# Patient Record
Sex: Male | Born: 1970 | Race: Black or African American | Hispanic: No | Marital: Married | Smoking: Never smoker
Health system: Southern US, Community
[De-identification: ages and names within clinical notes are randomized; demographics above are authoritative.]

## PROBLEM LIST (undated history)

## (undated) DIAGNOSIS — Q250 Patent ductus arteriosus: Principal | ICD-10-CM

## (undated) DIAGNOSIS — R0683 Snoring: Secondary | ICD-10-CM

## (undated) HISTORY — DX: Snoring: R06.83

## (undated) HISTORY — DX: Patent ductus arteriosus: Q25.0

## (undated) HISTORY — DX: Morbid (severe) obesity due to excess calories: E66.01

---

## 2015-07-09 ENCOUNTER — Telehealth: Payer: Self-pay

## 2015-07-09 NOTE — Telephone Encounter (Signed)
NO NOTES, CALLED PATIENT NO ANSWER,PHONE NUMBER IS D/ AND NO PCP

## 2015-07-12 ENCOUNTER — Ambulatory Visit (INDEPENDENT_AMBULATORY_CARE_PROVIDER_SITE_OTHER): Payer: BC Managed Care – PPO | Admitting: Interventional Cardiology

## 2015-07-12 ENCOUNTER — Encounter: Payer: Self-pay | Admitting: Interventional Cardiology

## 2015-07-12 VITALS — BP 118/74 | HR 74 | Ht 63.0 in | Wt 271.8 lb

## 2015-07-12 DIAGNOSIS — Q250 Patent ductus arteriosus: Secondary | ICD-10-CM | POA: Insufficient documentation

## 2015-07-12 DIAGNOSIS — R0683 Snoring: Secondary | ICD-10-CM

## 2015-07-12 HISTORY — DX: Patent ductus arteriosus: Q25.0

## 2015-07-12 HISTORY — DX: Snoring: R06.83

## 2015-07-12 HISTORY — DX: Morbid (severe) obesity due to excess calories: E66.01

## 2015-07-12 NOTE — Patient Instructions (Addendum)
Medication Instructions:  Your physician recommends that you continue on your current medications as directed. Please refer to the Current Medication list given to you today.   Labwork:    Testing/Procedures: Your physician has requested that you have an echocardiogram. Echocardiography is a painless test that uses sound waves to create images of your heart. It provides your doctor with information about the size and shape of your heart and how well your heart's chambers and valves are working. This procedure takes approximately one hour. There are no restrictions for this procedure.   Follow-Up:  Your physician wants you to follow-up in: 18-24 months with Dr.Smith You will receive a reminder letter in the mail two months in advance. If you don't receive a letter, please call our office to schedule the follow-up appointment.   Any Other Special Instructions Will Be Listed Below (If Applicable).     If you need a refill on your cardiac medications before your next appointment, please call your pharmacy.

## 2015-07-12 NOTE — Progress Notes (Signed)
Cardiology Office Note   Date:  07/12/2015   ID:  Brent Hernandez, DOB Jun 03, 1970, MRN [id number]  PCP:  No PCP Per Patient  Cardiologist:  Lesleigh Noe, MD   Chief Complaint  Patient presents with  . Heart Murmur    PDA      History of Present Illness: Brent Hernandez is a 45 y.o. male who presents for Establishment and follow-up of patent ductus arteriosus  Asymptomatic but the diagnosis was made at [facility] by heart catheterization in 2000. This was in the setting of chest discomfort that led to coronary angiography as well as left and right heart cath. Shot was documented to be 1.2-1. Dr. Nolon Rod was his attending. He is asymptomatic. He denies dyspnea. No lower extremity swelling, palpitations, syncope, or other complaints.  Past Medical History  Diagnosis Date  . PDA (patent ductus arteriosus) 07/12/2015  . Morbid obesity (HCC) 07/12/2015  . Snoring 07/12/2015    No past surgical history on file.   No current outpatient prescriptions on file.   No current facility-administered medications for this visit.    Allergies:   Review of patient's allergies indicates no known allergies.    Social History:  The patient  reports that he has never smoked. He has never used smokeless tobacco.   Family History:  The patient's family history includes Diabetes Mellitus II in his brother and father; Healthy in his maternal grandfather, mother, and sister; Prostate cancer in his father and paternal grandfather.    ROS:  Please see the history of present illness.   Otherwise, review of systems are positive for Obesity and needs to exercise.   All other systems are reviewed and negative.    PHYSICAL EXAM: VS:  BP 118/74 mmHg  Pulse 74  Ht  (1.6 m)  Wt 271 lb 12.8 oz (123.288 kg)  BMI 48.16 kg/m2 , BMI Body mass index is 48.16 kg/(m^2). GEN: Well nourished, well developed, in no acute distress HEENT: normal Neck: no JVD, carotid bruits, or  masses Cardiac: RRR.  There is no murmur, rub, or gallop. There is no edema. Respiratory:  clear to auscultation bilaterally, normal work of breathing. GI: soft, nontender, nondistended, + BS MS: no deformity or atrophy Skin: warm and dry, no rash Neuro:  Strength and sensation are intact Psych: euthymic mood, full affect   EKG:  EKG is ordered today. The ekg reveals normal sinus rhythm with prominent voltage and nonspecific ST-T wave abnormality.   Recent Labs: No results found for requested labs within last 365 days.    Lipid Panel No results found for: CHOL, TRIG, HDL, CHOLHDL, VLDL, LDLCALC, LDLDIRECT    Wt Readings from Last 3 Encounters:  07/12/15 271 lb 12.8 oz (123.288 kg)      Other studies Reviewed: Additional studies/ records that were reviewed today include: Review of Epic medical records from [place]. The findings include documentation of PDA by left and right heart cath with a left to right shunt of 1.2.    ASSESSMENT AND PLAN:  1. Patent ductus arteriosis which is asymptomatic. Documented showed 1.2-1 2. Obesity, morbid 3. Snoring, but no symptoms to suggest sleep apnea   Current medicines are reviewed at length with the patient today.  The patient has the following concerns regarding medicines: .  The following changes/actions have been instituted:    2D doppler echocardiogram  Labs/ tests ordered today include:   Orders Placed This Encounter  Procedures  . EKG 12-Lead  .  Echocardiogram     Disposition:   FU with HS in 2 years  Signed, Lesleigh NoeSMITH III,Miya Luviano W, MD  07/12/2015 1:31 PM    [facility] [address], [place], [place]  [address] Phone: [phone]; Fax: [phone]

## 2015-07-28 ENCOUNTER — Ambulatory Visit (HOSPITAL_COMMUNITY): Payer: BC Managed Care – PPO | Attending: Cardiovascular Disease

## 2015-07-28 ENCOUNTER — Other Ambulatory Visit: Payer: Self-pay

## 2015-07-28 DIAGNOSIS — I517 Cardiomegaly: Secondary | ICD-10-CM | POA: Diagnosis not present

## 2015-07-28 DIAGNOSIS — Q250 Patent ductus arteriosus: Secondary | ICD-10-CM

## 2015-07-28 DIAGNOSIS — Z6841 Body Mass Index (BMI) 40.0 and over, adult: Secondary | ICD-10-CM | POA: Insufficient documentation

## 2019-11-16 NOTE — Progress Notes (Signed)
Cardiology Office Note:    Date:  11/17/2019   ID:  [name], DOB 1970/11/08, MRN [id number]  PCP:  Lavone Orn, MD  Cardiologist:  No primary care provider on file.   Referring MD: Lavone Orn, MD   Chief Complaint  Patient presents with  . Advice Only    Known patent ductus arteriosus.    History of Present Illness:    Brent Hernandez is a 49 y.o. male with a hx of PDA with 1.2 to 1 left to right shunt [facility] by heart catheterization in 2000) in setting of chest pain evaluation.  The patient was last seen in 2017.  At that time he was doing well.  An echocardiogram did not demonstrate any evidence of right heart volume or pressure overload.  A patent ductus arteriosus could not be identified with the echo study that was performed.  Past Medical History:  Diagnosis Date  . Morbid obesity ([place]) 07/12/2015  . PDA (patent ductus arteriosus) 07/12/2015  . Snoring 07/12/2015    History reviewed. No pertinent surgical history.  Current Medications: No outpatient medications have been marked as taking for the 11/17/19 encounter (Office Visit) with Belva Crome, MD.     Allergies:   Patient has no known allergies.   Social History   Socioeconomic History  . Marital status: Single    Spouse name: Not on file  . Number of children: Not on file  . Years of education: Not on file  . Highest education level: Not on file  Occupational History  . Not on file  Tobacco Use  . Smoking status: Never Smoker  . Smokeless tobacco: Never Used  Substance and Sexual Activity  . Alcohol use: Yes    Alcohol/week: 0.0 standard drinks    Comment: Ocassionally  . Drug use: Not on file  . Sexual activity: Not on file  Other Topics Concern  . Not on file  Social History Narrative  . Not on file   Social Determinants of Health   Financial Resource Strain:   . Difficulty of Paying Living Expenses:   Food Insecurity:   . Worried About Charity fundraiser in the Last Year:   . Youth worker in the Last Year:   Transportation Needs:   . Film/video editor (Medical):   Marland Kitchen Lack of Transportation (Non-Medical):   Physical Activity:   . Days of Exercise per Week:   . Minutes of Exercise per Session:   Stress:   . Feeling of Stress :   Social Connections:   . Frequency of Communication with Friends and Family:   . Frequency of Social Gatherings with Friends and Family:   . Attends Religious Services:   . Active Member of Clubs or Organizations:   . Attends Archivist Meetings:   Marland Kitchen Marital Status:      Family History: The patient's family history includes Diabetes Mellitus II in his brother and father; Healthy in his maternal grandfather, mother, and sister; Prostate cancer in his father and paternal grandfather.  ROS:   Please see the history of present illness.    No complaints.  Came back to check-in since last contact with cardiology was 4 years ago.  All other systems reviewed and are negative.  EKGs/Labs/Other Studies Reviewed:    The following studies were reviewed today:  2D Doppler echocardiogram 2017: Study Conclusions   - Left ventricle: The cavity size was normal. Systolic function was  normal. The estimated ejection fraction  was in the range of 55%  to 60%. Wall motion was normal; there were no regional wall  motion abnormalities.  - Left atrium: The atrium was mildly dilated.  - Atrial septum: No defect or patent foramen ovale was identified.  - Impressions: Ductas flow not identified on basal short axis  images of MPA or suprasternal notch images of the aorta  Consider f/u cardiac MRI or CTA.   Impressions:   - Ductas flow not identified on basal short axis images of MPA or  suprasternal notch images of the aorta  Consider f/u cardiac MRI or CTA.   EKG:  EKG normal sinus rhythm with nonspecific ST abnormality.  Recent Labs: No results found for requested labs within last 8760 hours.  Recent Lipid Panel No  results found for: CHOL, TRIG, HDL, CHOLHDL, VLDL, LDLCALC, LDLDIRECT  Physical Exam:    VS:  BP 128/62   Pulse 63   Ht 5' 3"  (1.6 m)   Wt 260 lb 6.4 oz (118.1 kg)   SpO2 98%   BMI 46.13 kg/m     Wt Readings from Last 3 Encounters:  11/17/19 260 lb 6.4 oz (118.1 kg)  07/12/15 271 lb 12.8 oz (123.3 kg)     GEN: Obese, morbidly.. No acute distress HEENT: Normal NECK: No JVD. LYMPHATICS: No lymphadenopathy CARDIAC:  RRR without murmur, gallop, or edema. VASCULAR:  Normal Pulses. No bruits. RESPIRATORY:  Clear to auscultation without rales, wheezing or rhonchi  ABDOMEN: Soft, non-tender, non-distended, No pulsatile mass, MUSCULOSKELETAL: No deformity  SKIN: Warm and dry NEUROLOGIC:  Alert and oriented x 3 PSYCHIATRIC:  Normal affect   ASSESSMENT:    1. PDA (patent ductus arteriosus)   2. Morbid obesity ([place])   3. Snoring   4. Educated about COVID-19 virus infection    PLAN:    In order of problems listed above:  1. PDA could be a source/nidus for endocarditis. Rarely can be associated with aneurysm formation. Needs cardiac CT to evaluate for structural abnormality and presence or absence of PDA.Marland Kitchen  Clinical exam does not suggest volume overload or other issues.  If RV enlargement is noted, we may consider doing a sleep study given his morbid obesity. 2. Morbid obesity 3. Snoring in the setting of morbid obesity could represent undiagnosed sleep apnea. 4. Vaccine is been received, mRNA.  Likely follow-up in 2 to 4 years.  CT scan will be evaluated and conversation with the patient after the study is available.  Further work-up could be necessary if there is any signal that there is RV enlargement.   Medication Adjustments/Labs and Tests Ordered: Current medicines are reviewed at length with the patient today.  Concerns regarding medicines are outlined above.  Orders Placed This Encounter  Procedures  . CT CORONARY MORPH W/CTA COR W/SCORE W/CA W/CM &/OR WO/CM  . Basic  metabolic panel  . EKG 12-Lead   No orders of the defined types were placed in this encounter.   Patient Instructions  Medication Instructions:  Your physician recommends that you continue on your current medications as directed. Please refer to the Current Medication list given to you today.  *If you need a refill on your cardiac medications before your next appointment, please call your pharmacy*   Lab Work: BMET today  If you have labs (blood work) drawn today and your tests are completely normal, you will receive your results only by: Marland Kitchen MyChart Message (if you have MyChart) OR . A paper copy in the mail If you  have any lab test that is abnormal or we need to change your treatment, we will call you to review the results.   Testing/Procedures: Your physician recommends that you have a Coronary CT performed.   Follow-Up: At [facility], you and your health needs are our priority.  As part of our continuing mission to provide you with exceptional heart care, we have created designated Provider Care Teams.  These Care Teams include your primary Cardiologist (physician) and Advanced Practice Providers (APPs -  Physician Assistants and Nurse Practitioners) who all work together to provide you with the care you need, when you need it.  We recommend signing up for the patient portal called "MyChart".  Sign up information is provided on this After Visit Summary.  MyChart is used to connect with patients for Virtual Visits (Telemedicine).  Patients are able to view lab/test results, encounter notes, upcoming appointments, etc.  Non-urgent messages can be sent to your provider as well.   To learn more about what you can do with MyChart, go to [email or web address].    Your next appointment:   3 year(s)  The format for your next appointment:   In Person  Provider:   You may see Dr. Daneen Schick or one of the following Advanced Practice Providers on your designated Care Team:     Truitt Merle, NP  Cecilie Kicks, NP  Kathyrn Drown, NP    Other Instructions  Your cardiac CT will be scheduled at one of the below locations:   [facility] [address] [place], [place] [address] [phone]  [place] [address] [place], [place] [address] [phone]  If scheduled at [facility], please arrive at the [facility] main entrance of [facility] 30 minutes prior to test start time. Proceed to the [facility] Radiology Department (first floor) to check-in and test prep.  If scheduled at [facility], please arrive 15 mins early for check-in and test prep.  Please follow these instructions carefully (unless otherwise directed):  Hold all erectile dysfunction medications at least 3 days (72 hrs) prior to test.  On the Night Before the Test: . Be sure to Drink plenty of water. . Do not consume any caffeinated/decaffeinated beverages or chocolate 12 hours prior to your test. . Do not take any antihistamines 12 hours prior to your test.  On the Day of the Test: . Drink plenty of water. Do not drink any water within one hour of the test. . Do not eat any food 4 hours prior to the test. . You may take your regular medications prior to the test.  . Take metoprolol (Lopressor) two hours prior to test.       After the Test: . Drink plenty of water. . After receiving IV contrast, you may experience a mild flushed feeling. This is normal. . On occasion, you may experience a mild rash up to 24 hours after the test. This is not dangerous. If this occurs, you can take Benadryl 25 mg and increase your fluid intake. . If you experience trouble breathing, this can be serious. If it is severe call 911 IMMEDIATELY. If it is mild, please call our office. . If you take any of these medications: Glipizide/Metformin, Avandament, Glucavance, please do not take 48  hours after completing test unless otherwise instructed.   Once we have confirmed authorization from your insurance company, we will call you to set up a  date and time for your test. Based on how quickly your insurance processes prior authorizations requests, please allow up to 4 weeks to be contacted for scheduling your Cardiac CT appointment. Be advised that routine Cardiac CT appointments could be scheduled as many as 8 weeks after your provider has ordered it.  For non-scheduling related questions, please contact the cardiac imaging nurse navigator should you have any questions/concerns: Marchia Bond, Cardiac Imaging Nurse Navigator Burley Saver, Interim Cardiac Imaging Nurse [place] [facility] Direct Office Dial: [phone]   For scheduling needs, including cancellations and rescheduling, please call Vivien Rota at [phone], option 3.       Signed, Sinclair Grooms, MD  11/17/2019 6:53 PM    [place]

## 2019-11-17 ENCOUNTER — Encounter: Payer: Self-pay | Admitting: Interventional Cardiology

## 2019-11-17 ENCOUNTER — Other Ambulatory Visit: Payer: Self-pay

## 2019-11-17 ENCOUNTER — Ambulatory Visit: Payer: BC Managed Care – PPO | Admitting: Interventional Cardiology

## 2019-11-17 VITALS — BP 128/62 | HR 63 | Ht 63.0 in | Wt 260.4 lb

## 2019-11-17 DIAGNOSIS — Q250 Patent ductus arteriosus: Secondary | ICD-10-CM

## 2019-11-17 DIAGNOSIS — R0683 Snoring: Secondary | ICD-10-CM

## 2019-11-17 DIAGNOSIS — Z7189 Other specified counseling: Secondary | ICD-10-CM | POA: Diagnosis not present

## 2019-11-17 NOTE — Patient Instructions (Addendum)
Medication Instructions:  Your physician recommends that you continue on your current medications as directed. Please refer to the Current Medication list given to you today.  *If you need a refill on your cardiac medications before your next appointment, please call your pharmacy*   Lab Work: BMET today  If you have labs (blood work) drawn today and your tests are completely normal, you will receive your results only by: Marland Kitchen MyChart Message (if you have MyChart) OR . A paper copy in the mail If you have any lab test that is abnormal or we need to change your treatment, we will call you to review the results.   Testing/Procedures: Your physician recommends that you have a Coronary CT performed.   Follow-Up: At [facility], you and your health needs are our priority.  As part of our continuing mission to provide you with exceptional heart care, we have created designated Provider Care Teams.  These Care Teams include your primary Cardiologist (physician) and Advanced Practice Providers (APPs -  Physician Assistants and Nurse Practitioners) who all work together to provide you with the care you need, when you need it.  We recommend signing up for the patient portal called "MyChart".  Sign up information is provided on this After Visit Summary.  MyChart is used to connect with patients for Virtual Visits (Telemedicine).  Patients are able to view lab/test results, encounter notes, upcoming appointments, etc.  Non-urgent messages can be sent to your provider as well.   To learn more about what you can do with MyChart, go to [email or web address].    Your next appointment:   3 year(s)  The format for your next appointment:   In Person  Provider:   You may see Dr. Daneen Schick or one of the following Advanced Practice Providers on your designated Care Team:    Truitt Merle, NP  Cecilie Kicks, NP  Kathyrn Drown, NP    Other Instructions  Your cardiac CT will be scheduled at one  of the below locations:   [facility] [address] [place], [place] [address] [phone]  [place] [address] [place], [place] [address] [phone]  If scheduled at [facility], please arrive at the [facility] main entrance of [facility] 30 minutes prior to test start time. Proceed to the [facility] Radiology Department (first floor) to check-in and test prep.  If scheduled at [facility], please arrive 15 mins early for check-in and test prep.  Please follow these instructions carefully (unless otherwise directed):  Hold all erectile dysfunction medications at least 3 days (72 hrs) prior to test.  On the Night Before the Test: . Be sure to Drink plenty of water. . Do not consume any caffeinated/decaffeinated beverages or chocolate 12 hours prior to your test. . Do not take any antihistamines 12 hours prior to your test.  On the Day of the Test: . Drink plenty of water. Do not drink any water within one hour of the test. . Do not eat any food 4 hours prior to the test. . You may take your regular medications prior to the test.        After the Test: . Drink plenty of water. . After receiving IV contrast, you may experience a mild flushed feeling. This is normal. . On occasion, you may experience a mild rash up to 24 hours after the test. This is not dangerous. If this  occurs, you can take Benadryl 25 mg and increase your fluid intake. . If you experience trouble breathing, this can be serious. If it is severe call 911 IMMEDIATELY. If it is mild, please call our office. . If you take any of these medications: Glipizide/Metformin, Avandament, Glucavance, please do not take 48 hours after completing test unless otherwise instructed.   Once we have confirmed authorization from your insurance company, we will call you to set up a date and time for your test.  Based on how quickly your insurance processes prior authorizations requests, please allow up to 4 weeks to be contacted for scheduling your Cardiac CT appointment. Be advised that routine Cardiac CT appointments could be scheduled as many as 8 weeks after your provider has ordered it.  For non-scheduling related questions, please contact the cardiac imaging nurse navigator should you have any questions/concerns: Marchia Bond, Cardiac Imaging Nurse Navigator Burley Saver, Interim Cardiac Imaging Nurse [place] [facility] Services Direct Office Dial: [phone]   For scheduling needs, including cancellations and rescheduling, please call Vivien Rota at [phone], option 3.

## 2019-11-18 ENCOUNTER — Telehealth: Payer: Self-pay | Admitting: *Deleted

## 2019-11-18 LAB — BASIC METABOLIC PANEL
BUN/Creatinine Ratio: 16 (ref 9–20)
BUN: 15 mg/dL (ref 6–24)
CO2: 24 mmol/L (ref 20–29)
Calcium: 9.4 mg/dL (ref 8.7–10.2)
Chloride: 102 mmol/L (ref 96–106)
Creatinine, Ser: 0.93 mg/dL (ref 0.76–1.27)
GFR calc Af Amer: 111 mL/min/{1.73_m2} (ref >59)
GFR calc non Af Amer: 96 mL/min/{1.73_m2} (ref >59)
Glucose: 111 mg/dL — ABNORMAL HIGH (ref 65–99)
Potassium: 4.3 mmol/L (ref 3.5–5.2)
Sodium: 139 mmol/L (ref 134–144)

## 2019-11-18 NOTE — Telephone Encounter (Signed)
Left message to call back.   Need to go over Coronary CT instructions.

## 2019-11-18 NOTE — Telephone Encounter (Signed)
-----   Message from Julio Sicks, RN sent at 11/17/2019  4:17 PM EDT ----- Contact pt with new information on Coronary CT

## 2019-11-19 NOTE — Addendum Note (Signed)
Addended by: Julio Sicks on: 11/19/2019 08:26 AM   Modules accepted: Orders

## 2019-11-25 NOTE — Telephone Encounter (Signed)
Left message to call back  

## 2019-12-01 NOTE — Telephone Encounter (Signed)
Spoke with pt and reviewed CT instructions.  Pt verified that he did receive his physical copy.  Advised to call back if he has any questions. Pt appreciative for call.

## 2019-12-19 ENCOUNTER — Telehealth (HOSPITAL_COMMUNITY): Payer: Self-pay | Admitting: Emergency Medicine

## 2019-12-19 NOTE — Telephone Encounter (Signed)
Reaching out to patient to offer assistance regarding upcoming cardiac imaging study; pt verbalizes understanding of appt date/time, parking situation and where to check in, pre-test NPO status and medications ordered, and verified current allergies; name and call back number provided for further questions should they arise Endre Coutts RN Navigator Cardiac Imaging [facility] [phone] office [phone] cell 

## 2019-12-22 ENCOUNTER — Ambulatory Visit (HOSPITAL_COMMUNITY)
Admission: RE | Admit: 2019-12-22 | Discharge: 2019-12-22 | Disposition: A | Discharge status: Home / Self Care | Payer: BC Managed Care – PPO | Source: Ambulatory Visit | Attending: Interventional Cardiology | Admitting: Interventional Cardiology

## 2019-12-22 ENCOUNTER — Encounter (HOSPITAL_COMMUNITY): Payer: Self-pay

## 2019-12-22 ENCOUNTER — Other Ambulatory Visit: Payer: Self-pay

## 2019-12-22 DIAGNOSIS — Q250 Patent ductus arteriosus: Secondary | ICD-10-CM | POA: Insufficient documentation

## 2019-12-22 IMAGING — CT CT HEART MORP W/ CTA COR W/ SCORE W/ CA W/CM &/OR W/O CM
2 of 7 series · 11 of 20 positions shown, 13 images · IV contrast (APPLIED)
Comparison: None.
COMPARISON: None.

Addendum:
EXAM:
OVER-READ INTERPRETATION  CT CHEST

The following report is an over-read performed by radiologist Dr.
GIATAS [REDACTED] on [DATE]. This
over-read does not include interpretation of cardiac or coronary
anatomy or pathology. The coronary calcium score/coronary CTA
interpretation by the cardiologist is attached.
CLINICAL DATA: Congential Heart Disease?  PDA
Cardiac CTA
MEDICATIONS:
None
TECHNIQUE: The patient was scanned on a Siemens Force 192 scanner. Gantry
rotation speed was 250 msecs. Collimation was. 6 mm . A 120 kV
retrospective scan was triggered in the ascending thoracic aorta at
140 HU's . Average HR during the scan was 63 bpm. The 3D data set
was interpreted on a dedicated work station using MPR, MIP and VRT
modes. A total of 80 cc of contrast was used.

[Series 8: 0-90% · axial · 0.39mm/px · z∈[+1231,+1325]mm · 5 of 2350 slices shown]
[im 392/2350  vessel]
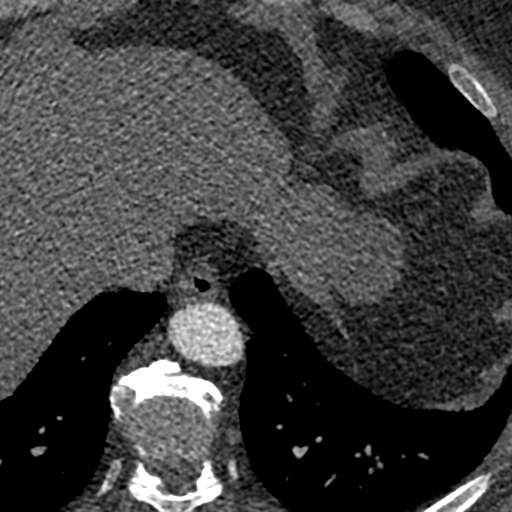
[im 784/2350  vessel]
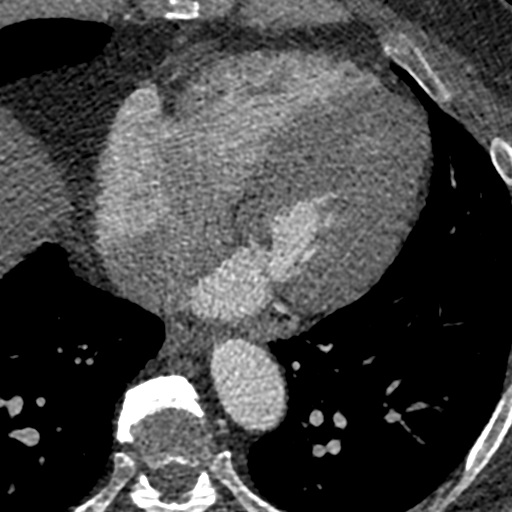
[im 1175/2350  vessel]
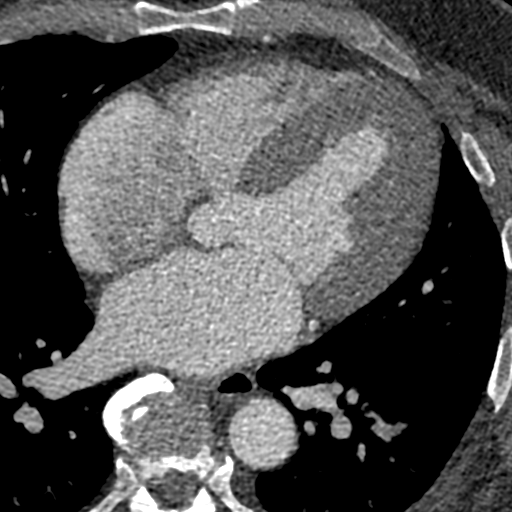
[im 1567/2350  vessel]
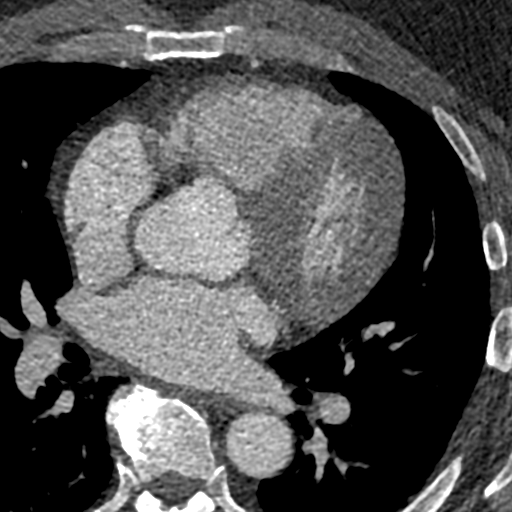
[im 1958/2350  vessel]
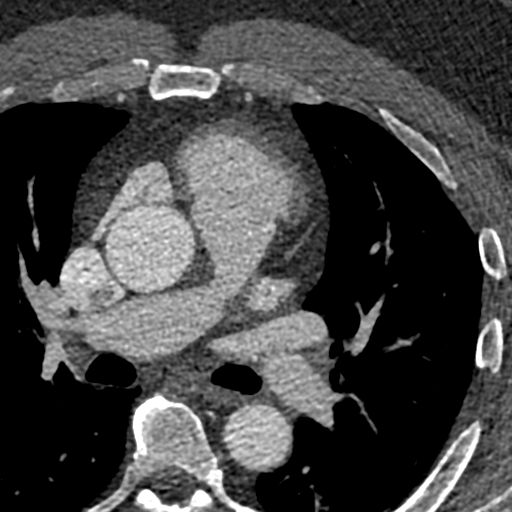

[Series 9: 5-95% · axial · 0.39mm/px · z∈[+1228,+1329]mm · 6 of 2350 slices shown, 8 images]
[im 336/2350  vessel]
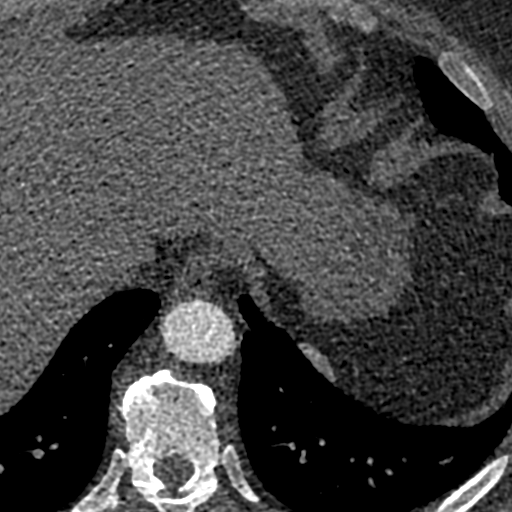
[im 336/2350  lung]
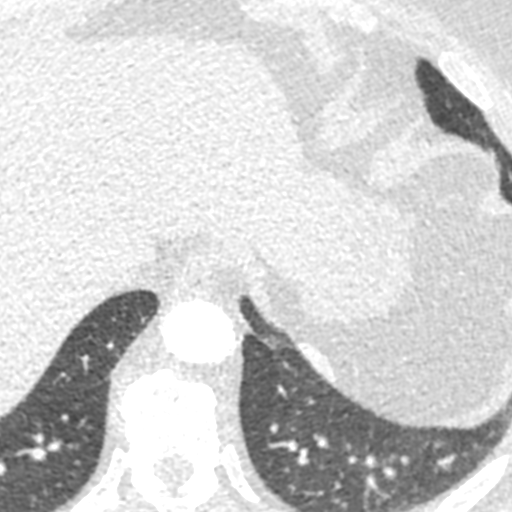
[im 672/2350  vessel]
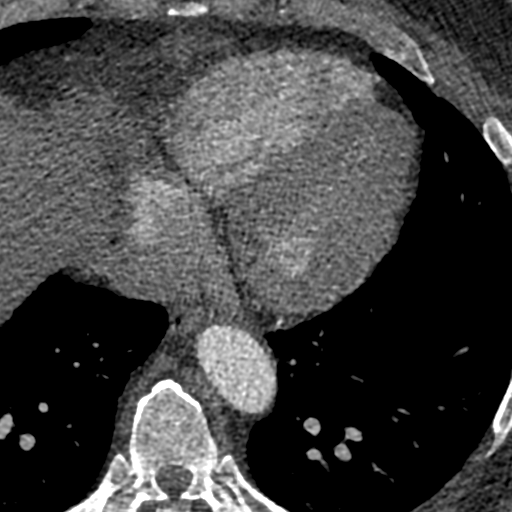
[im 1007/2350  vessel]
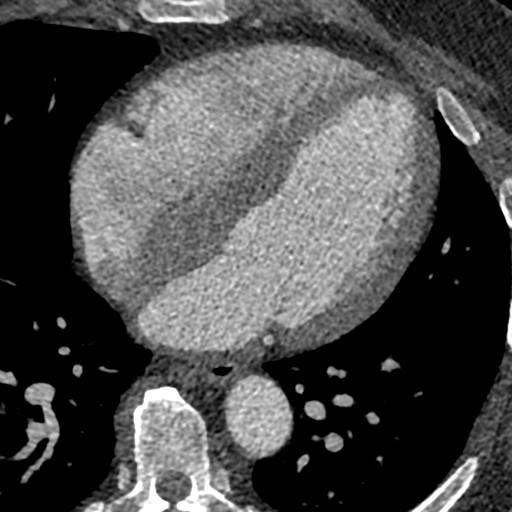
[im 1343/2350  vessel]
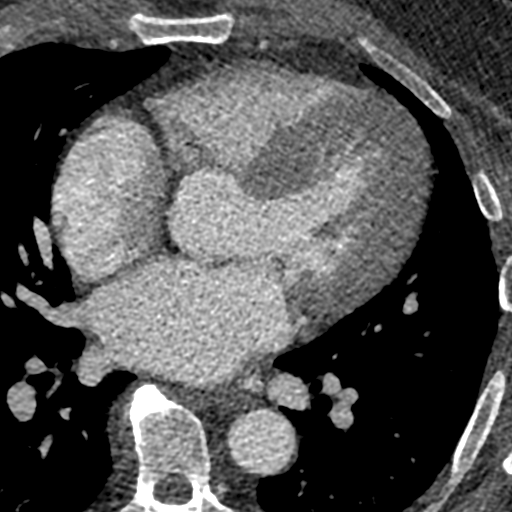
[im 1678/2350  vessel]
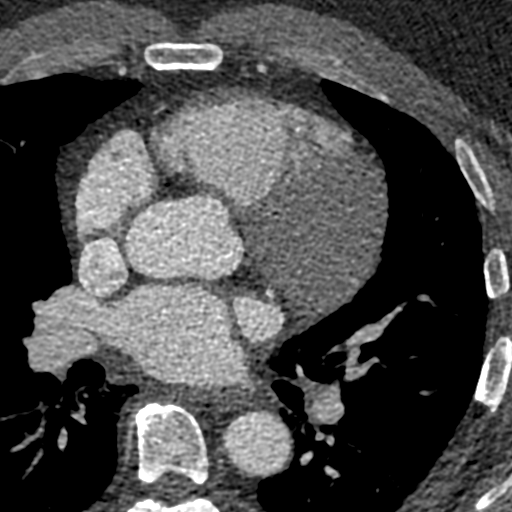
[im 1678/2350  lung]
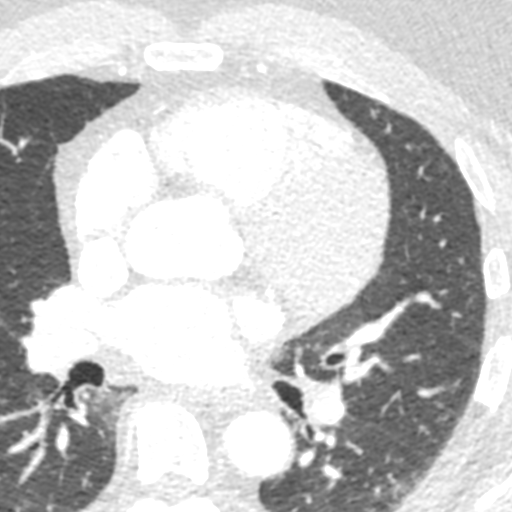
[im 2014/2350  vessel]
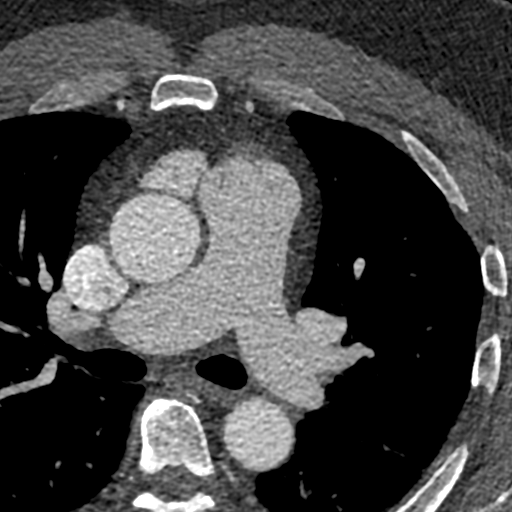

[11 of 20 positions shown; findings below may reference images not displayed]

FINDINGS: Within the visualized portions of the thorax there are no suspicious
appearing pulmonary nodules or masses, there is no acute
consolidative airspace disease, no pleural effusions, no
pneumothorax and no lymphadenopathy. Visualized portions of the
upper abdomen are unremarkable. There are no aggressive appearing
lytic or blastic lesions noted in the visualized portions of the
skeleton.
IMPRESSION: No significant incidental noncardiac findings are noted.
FINDINGS: Coronary Arteries: Left dominant Normal Calcium score 0 no anomaly

Structural: No ASD/PFO/VSD. Normal LV size and function. Mild RV
enlargement and RA enlargement. Mild MPA enlargement 3.4 cm RMPA
cm and LMPA 2.5 cm in double oblique views. Tri leaflet AV Normal
aortic root measurements below Normal arch vessels with tortuous
innominate A non patent ductus is visualized on the inferior surface
of the arch. It appears closed with no connection to the PA and no
calcification

Aortic sinus: 3.4 cm

Aortic Root: 3.4 cm

STJ 2.8 cm

Descending Thoracic Aorta: 2.5 cm

Aortic Arch: 3.0 cm

Normal PV drainage into the LA No GIATAS thrombus No left sided SVC and
normal coronary sinus size
IMPRESSION: 1. Calcium Score 0 normal left dominant coronary arteries

2.   PDA appears closed with no flow from aorta to MPA

3. Normal aortic root 3.4 cm with normal arch vessels and tortuous
right innominate

4.  Normal tri-leaflet AV

5.  No ASD/PFO/VSD

6.  Normal PV drainage with no GIATAS thrombus

GIATAS

*** End of Addendum ***
EXAM:
OVER-READ INTERPRETATION  CT CHEST

The following report is an over-read performed by radiologist Dr.
GIATAS [REDACTED] on [DATE]. This
over-read does not include interpretation of cardiac or coronary
anatomy or pathology. The coronary calcium score/coronary CTA
interpretation by the cardiologist is attached.
FINDINGS: Within the visualized portions of the thorax there are no suspicious
appearing pulmonary nodules or masses, there is no acute
consolidative airspace disease, no pleural effusions, no
pneumothorax and no lymphadenopathy. Visualized portions of the
upper abdomen are unremarkable. There are no aggressive appearing
lytic or blastic lesions noted in the visualized portions of the
skeleton.
IMPRESSION: No significant incidental noncardiac findings are noted.

## 2019-12-22 MED ORDER — IOHEXOL 350 MG/ML SOLN
80.0000 mL | Freq: Once | INTRAVENOUS | Status: AC | PRN
  Administered 2019-12-22: 80 mL via INTRAVENOUS

## 2021-08-02 ENCOUNTER — Telehealth: Payer: Self-pay | Admitting: Interventional Cardiology

## 2021-08-02 NOTE — Telephone Encounter (Signed)
Spoke with the patient who reports that yesterday around noon he started having some discomfort in the top of his chest. He states that he was also feeling a bit fatigued. He denies any radiation of pain. He describes it a heaviness/soreness. He does states that it worsens when he turns and that it is a bit tender to touch. Pain is not constant. He does not recall doing any activity out of the normal that could have caused a strained a muscle . He did take a baby aspirin yesterday and rested after work which helped. He states that today he is feeling better but can still feel a bit of discomfort. He also reports some fogginess. He has not been keeping up with his BP/HR. He is currently not taking any medications. Patient had a coronary CTA in 2021 with calcium score of 0.  ?Discussed with Dr. Katrinka Blazing who recommends that the patient follow up with his PCP. Patient is aware.  ?

## 2021-08-02 NOTE — Telephone Encounter (Signed)
Pt c/o of Chest Pain: STAT if CP now or developed within 24 hours ? ?1. Are you having CP right now? No  ? ?2. Are you experiencing any other symptoms (ex. SOB, nausea, vomiting, sweating)? Had a light headache yesterday  ? ?3. How long have you been experiencing CP? Started yesterday  ? ?4. Is your CP continuous or coming and going? Continuous  ? ?5. Have you taken Nitroglycerin? No ? ? ?Patient states it is a soreness that felt like he pulled a muscle. Took some aspirin before going to sleep yesterday states he is feeling a little bit better now. ??  ?

## 2021-08-08 ENCOUNTER — Ambulatory Visit: Payer: BC Managed Care – PPO | Admitting: Interventional Cardiology

## 2023-05-28 ENCOUNTER — Ambulatory Visit (HOSPITAL_BASED_OUTPATIENT_CLINIC_OR_DEPARTMENT_OTHER): Payer: BC Managed Care – PPO | Admitting: Cardiovascular Disease

## 2023-06-06 ENCOUNTER — Ambulatory Visit (HOSPITAL_BASED_OUTPATIENT_CLINIC_OR_DEPARTMENT_OTHER): Payer: 59 | Admitting: Cardiovascular Disease

## 2023-06-06 ENCOUNTER — Encounter (HOSPITAL_BASED_OUTPATIENT_CLINIC_OR_DEPARTMENT_OTHER): Payer: Self-pay | Admitting: Cardiovascular Disease

## 2023-06-06 VITALS — BP 130/80 | HR 54 | Ht 63.0 in | Wt 231.0 lb

## 2023-06-06 DIAGNOSIS — Q250 Patent ductus arteriosus: Secondary | ICD-10-CM | POA: Diagnosis not present

## 2023-06-06 NOTE — Progress Notes (Signed)
 Cardiology Office Note:  .   Date:  06/06/2023  ID:  Brent Hernandez, DOB 1970/04/13, MRN [id number] PCP: Brent Funk, MD (Inactive)  [facility] Providers Cardiologist:  None    History of Present Illness: .    Brent Hernandez is a 53 y.o. male with PDA here for follow-up.  He last saw Dr. Katrinka Hernandez in 2021.  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  He underwent heart catheterization at [facility] in 2000.  Echo 07/2015 revealed LVEF 55-60%.  Ductus flow was not identified on imaging and cardiac MRI or CT was recommended.  He had a cardiac CT 12/2019 which revealed a calcium score of 0.  The PDA was closed with no flow from the aorta to the main pulmonary artery.  No other congenital abnormalities were noted.  Discussed the use of AI scribe software for clinical note transcription with the patient, who gave verbal consent to proceed.  [name] feels generally well and engages in physical activity by attending Brent Hernandez classes a couple of times a week. He experiences tiredness during the sessions but no discomfort afterward. No swelling in his legs or feet and no shortness of breath when lying down.  He uses a CPAP machine for sleep apnea, which he started using again about six months ago. He feels rested upon waking after using the CPAP.  He monitors his blood pressure at home using an [employer] machine, though he does not check it as frequently as recommended.  He sometimes experience high readings, which he attributes to not resting before checking. He consume a lot of coffee, which may affect the blood pressure readings.  He has a history of a patent ductus arteriosus (PDA) that was monitored for many years. It was last imaged in 2017 and was found to be closed. He has been advised to follow up every three years but has not done so since his previous doctor retired.  He notes that he has been prediabetic for years, but his condition has been stable. He usually has annual  physicals and lab work done, which have not shown significant issues recently.      ROS:  As per HPI  Studies Reviewed: .       Echo 07/2015: Study Conclusions   - Left ventricle: The cavity size was normal. Systolic function was    normal. The estimated ejection fraction was in the range of 55%    to 60%. Wall motion was normal; there were no regional wall    motion abnormalities.  - Left atrium: The atrium was mildly dilated.  - Atrial septum: No defect or patent foramen ovale was identified.  - Impressions: Ductas flow not identified on basal short axis    images of MPA or suprasternal notch images of the aorta    Consider f/u cardiac MRI or CTA.   Cardiac CT 12/2019: IMPRESSION: 1. Calcium Score 0 normal left dominant coronary arteries   2.   PDA appears closed with no flow from aorta to MPA   3. Normal aortic root 3.4 cm with normal arch vessels and tortuous right innominate   4.  Normal tri-leaflet AV   5.  No ASD/PFO/VSD   6.  Normal PV drainage with no LAA thrombus Risk Assessment/Calculations:             Physical Exam:   VS:  BP 130/80 (BP Location: Left Arm, Patient Position: Sitting, Cuff Size: Normal)   Pulse Brent Hernandez Kitchen)  54   Ht 5\' 3"  (1.6 m)   Wt 231 lb (104.8 kg)   BMI 40.92 kg/m  , BMI Body mass index is 40.92 kg/m. GENERAL:  Well appearing HEENT: Pupils equal round and reactive, fundi not visualized, oral mucosa unremarkable NECK:  No jugular venous distention, waveform within normal limits, carotid upstroke brisk and symmetric, no bruits, no thyromegaly LUNGS:  Clear to auscultation bilaterally HEART:  RRR.  PMI not displaced or sustained,S1 and S2 within normal limits, no S3, no S4, no clicks, no rubs, no murmurs ABD:  Flat, positive bowel sounds normal in frequency in pitch, no bruits, no rebound, no guarding, no midline pulsatile mass, no hepatomegaly, no splenomegaly EXT:  2 plus pulses throughout, no edema, no cyanosis no clubbing SKIN:  No rashes no  nodules NEURO:  Cranial nerves II through XII grossly intact, motor grossly intact throughout PSYCH:  Cognitively intact, oriented to person place and time   ASSESSMENT AND PLAN: .    # Hypertension: Blood pressure at the upper limit of normal. Patient has home monitoring but does not consistently follow recommended protocols. Discussed the importance of accurate readings and the impact of caffeine and sodium intake. -Advise patient to follow recommended protocol for home blood pressure monitoring: rest for five minutes, avoid caffeine and sodium intake prior to measurement. -Check blood pressure at different times of the day and record readings. -Plan to review readings at next primary care visit. -BP goal <130/80  # Sleep Apnea: Patient uses CPAP machine most of the time and reports feeling rested upon waking. No symptoms of right ventricular strain. -Continue CPAP use as currently prescribed.  # Prediabetes: Stable, no recent changes. -Continue current management and monitor at next primary care visit.  # Patent Ductus Arteriosus (PDA): Patient reports no symptoms. Recent imaging suggests closure of the PDA.  No evidence of RV strain or dilation.  -No further action required at this time. Plan for routine follow-up in a couple of years.  General Health Maintenance: -Ensure cholesterol is checked at next primary care visit. -Encourage continuation of regular exercise and healthy diet. -Advise patient to monitor heart rate and report any symptoms of lightheadedness, dizziness, or fatigue.      Dispo: f/u as needed  Signed, Chilton Si, MD

## 2023-06-06 NOTE — Patient Instructions (Signed)
.  Medication Instructions:  Your physician recommends that you continue on your current medications as directed. Please refer to the Current Medication list given to you today.   *If you need a refill on your cardiac medications before your next appointment, please call your pharmacy*  Lab Work: NONE  Testing/Procedures: NONE  Follow-Up: At [facility], you and your health needs are our priority.  As part of our continuing mission to provide you with exceptional heart care, we have created designated Provider Care Teams.  These Care Teams include your primary Cardiologist (physician) and Advanced Practice Providers (APPs -  Physician Assistants and Nurse Practitioners) who all work together to provide you with the care you need, when you need it.  We recommend signing up for the patient portal called "MyChart".  Sign up information is provided on this After Visit Summary.  MyChart is used to connect with patients for Virtual Visits (Telemedicine).  Patients are able to view lab/test results, encounter notes, upcoming appointments, etc.  Non-urgent messages can be sent to your provider as well.   To learn more about what you can do with MyChart, go to [email or web address].    Your next appointment:   2 year(s)  Provider:   Chilton Si, MD
